# Patient Record
Sex: Female | Born: 2000 | Race: Asian | Hispanic: No | Marital: Single | Smoking: Never smoker
Health system: Southern US, Community
[De-identification: ages and names within clinical notes are randomized; demographics above are authoritative.]

## PROBLEM LIST (undated history)

## (undated) DIAGNOSIS — F419 Anxiety disorder, unspecified: Secondary | ICD-10-CM

## (undated) DIAGNOSIS — R519 Headache, unspecified: Secondary | ICD-10-CM

## (undated) HISTORY — PX: WISDOM TOOTH EXTRACTION: SHX21

---
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
## 2021-12-16 ENCOUNTER — Ambulatory Visit: Payer: BC Managed Care – PPO | Admitting: Podiatry

## 2021-12-16 DIAGNOSIS — R519 Headache, unspecified: Secondary | ICD-10-CM | POA: Insufficient documentation

## 2021-12-16 DIAGNOSIS — R51 Headache: Secondary | ICD-10-CM | POA: Insufficient documentation

## 2021-12-16 DIAGNOSIS — L600 Ingrowing nail: Secondary | ICD-10-CM | POA: Diagnosis not present

## 2021-12-18 ENCOUNTER — Telehealth: Payer: Self-pay | Admitting: Podiatry

## 2021-12-18 NOTE — Telephone Encounter (Signed)
Pts Mom called earlier and left a voice message on the nurse line but haven't heard back from anyone as of yet. She states they are leaving to go out of the country to [place] tomorrow and had concerns. Pt had ingrown toe nail removed on 4/4. She wants to make sure her symptoms are normal. She states she has pain that radiates from her toe to her thigh in which it may be nerve related. She wants to talk with someone about the treatment as well before she leaves the county.  ? ?Please advise. ?

## 2021-12-23 ENCOUNTER — Encounter: Payer: Self-pay | Admitting: Podiatry

## 2021-12-23 NOTE — Progress Notes (Signed)
?  Subjective:  ?Patient ID: Angela Hobbs, female    DOB: April 20, 2001,  MRN: [id number] ? ?Chief Complaint  ?Patient presents with  ? Ingrown Toenail  ? ? ?21 y.o. female presents with the above complaint.  Patient presents with right hallux medial border ingrown.  It is painful to touch is red swollen.  She would like to have it removed.  She states it hurts with ambulation and hurts with pressure pain scale is 8 out of 10.  She has not seen anyone else prior to seeing me.  She is taking antibiotics.  She denies any other acute complaints I encouraged her to finish her antibiotic course ? ? ?Review of Systems: Negative except as noted in the HPI. Denies N/V/F/Ch. ? ?No past medical history on file. ? ?Current Outpatient Medications:  ?  norgestimate-ethinyl estradiol (ORTHO-CYCLEN) 0.25-35 MG-MCG tablet, Take by mouth., Disp: , Rfl:  ?  norgestimate-ethinyl estradiol (SPRINTEC 28) 0.25-35 MG-MCG tablet, Take 1 tablet by mouth daily., Disp: , Rfl:  ?  propranolol (INDERAL) 20 MG tablet, Take by mouth., Disp: , Rfl:  ?  cephALEXin (KEFLEX) 500 MG capsule, Take by mouth every 8 (eight) hours., Disp: , Rfl:  ?  propranolol (INDERAL) 20 MG tablet, Take 20 mg by mouth 2 (two) times daily as needed., Disp: , Rfl:  ?  SPRINTEC 28 0.25-35 MG-MCG tablet, Take 1 tablet by mouth daily., Disp: , Rfl:  ? ?Social History  ? ?Tobacco Use  ?Smoking Status Never  ?Smokeless Tobacco Never  ? ? ?Allergies  ?Allergen Reactions  ? Pollen Extract Swelling  ? ?Objective:  ?There were no vitals filed for this visit. ?There is no height or weight on file to calculate BMI. ?Constitutional Well developed. ?Well nourished.  ?Vascular Dorsalis pedis pulses palpable bilaterally. ?Posterior tibial pulses palpable bilaterally. ?Capillary refill normal to all digits.  ?No cyanosis or clubbing noted. ?Pedal hair growth normal.  ?Neurologic Normal speech. ?Oriented to person, place, and time. ?Epicritic sensation to light touch grossly present  bilaterally.  ?Dermatologic Painful ingrowing nail at medial nail borders of the hallux nail right. ?No other open wounds. ?No skin lesions.  ?Orthopedic: Normal joint ROM without pain or crepitus bilaterally. ?No visible deformities. ?No bony tenderness.  ? ?Radiographs: None ?Assessment:  ? ?1. Ingrown toenail of right foot   ? ?Plan:  ?Patient was evaluated and treated and all questions answered. ? ?Ingrown Nail, right ?-Patient elects to proceed with minor surgery to remove ingrown toenail removal today. Consent reviewed and signed by patient. ?-Ingrown nail excised. See procedure note. ?-Educated on post-procedure care including soaking. Written instructions provided and reviewed. ?-Patient to follow up in 2 weeks for nail check. ? ?Procedure: Excision of Ingrown Toenail ?Location: Right 1st toe medial nail borders. ?Anesthesia: Lidocaine 1% plain; 1.5 mL and Marcaine 0.5% plain; 1.5 mL, digital block. ?Skin Prep: Betadine. ?Dressing: Silvadene; telfa; dry, sterile, compression dressing. ?Technique: Following skin prep, the toe was exsanguinated and a tourniquet was secured at the base of the toe. The affected nail border was freed, split with a nail splitter, and excised. Chemical matrixectomy was then performed with phenol and irrigated out with alcohol. The tourniquet was then removed and sterile dressing applied. ?Disposition: Patient tolerated procedure well. Patient to return in 2 weeks for follow-up.  ? ?No follow-ups on file. ?

## 2022-09-02 ENCOUNTER — Telehealth: Payer: Self-pay | Admitting: *Deleted

## 2022-09-02 NOTE — Telephone Encounter (Addendum)
Mother is wanting to know how to treat nail  until her upcoming appointment 09/15/21,patient has a possible ingrown, is draining, bleeding, discharge.  Patient is scheduled for 09/03/22@8 :30-[place] location, patient /mother aware.

## 2022-09-03 ENCOUNTER — Ambulatory Visit: Payer: BC Managed Care – PPO | Admitting: Podiatry

## 2022-09-03 DIAGNOSIS — L600 Ingrowing nail: Secondary | ICD-10-CM

## 2022-09-03 MED ORDER — DOXYCYCLINE HYCLATE 100 MG PO TABS: 100.0000 mg | ORAL_TABLET | Freq: Two times a day (BID) | ORAL | 0 refills | Status: AC

## 2022-09-10 NOTE — Progress Notes (Signed)
  Subjective:  Patient ID: Angela Hobbs, female    DOB: [birth year],  MRN: [id number]  Chief Complaint  Patient presents with   Ingrown Toenail    21 y.o. female presents with the above complaint.  Patient presents with right hallux lateral border ingrown.  Patient says the medial side is doing well.  She would like to have the bilateral lateral side removed it seems to be bothering her denies any other acute complaints.   Review of Systems: Negative except as noted in the HPI. Denies N/V/F/Ch.  No past medical history on file.  Current Outpatient Medications:    doxycycline (VIBRA-TABS) 100 MG tablet, Take 1 tablet (100 mg total) by mouth 2 (two) times daily for 10 days., Disp: 20 tablet, Rfl: 0   cephALEXin (KEFLEX) 500 MG capsule, Take by mouth every 8 (eight) hours., Disp: , Rfl:    norgestimate-ethinyl estradiol (ORTHO-CYCLEN) 0.25-35 MG-MCG tablet, Take by mouth., Disp: , Rfl:    norgestimate-ethinyl estradiol (SPRINTEC 28) 0.25-35 MG-MCG tablet, Take 1 tablet by mouth daily., Disp: , Rfl:    propranolol (INDERAL) 20 MG tablet, Take 20 mg by mouth 2 (two) times daily as needed., Disp: , Rfl:    propranolol (INDERAL) 20 MG tablet, Take by mouth., Disp: , Rfl:    SPRINTEC 28 0.25-35 MG-MCG tablet, Take 1 tablet by mouth daily., Disp: , Rfl:   Social History   Tobacco Use  Smoking Status Never  Smokeless Tobacco Never    Allergies  Allergen Reactions   Pollen Extract Swelling   Objective:  There were no vitals filed for this visit. There is no height or weight on file to calculate BMI. Constitutional Well developed. Well nourished.  Vascular Dorsalis pedis pulses palpable bilaterally. Posterior tibial pulses palpable bilaterally. Capillary refill normal to all digits.  No cyanosis or clubbing noted. Pedal hair growth normal.  Neurologic Normal speech. Oriented to person, place, and time. Epicritic sensation to light touch grossly present bilaterally.  Dermatologic  Painful ingrowing nail at lateral nail borders of the hallux nail right. No other open wounds. No skin lesions.  Orthopedic: Normal joint ROM without pain or crepitus bilaterally. No visible deformities. No bony tenderness.   Radiographs: None Assessment:   No diagnosis found.  Plan:  Patient was evaluated and treated and all questions answered.  Ingrown Nail, right -Patient elects to proceed with minor surgery to remove ingrown toenail removal today. Consent reviewed and signed by patient. -Ingrown nail excised. See procedure note. -Educated on post-procedure care including soaking. Written instructions provided and reviewed. -Patient to follow up in 2 weeks for nail check.  Procedure: Excision of Ingrown Toenail Location: Right 1st toe lateral nail borders. Anesthesia: Lidocaine 1% plain; 1.5 mL and Marcaine 0.5% plain; 1.5 mL, digital block. Skin Prep: Betadine. Dressing: Silvadene; telfa; dry, sterile, compression dressing. Technique: Following skin prep, the toe was exsanguinated and a tourniquet was secured at the base of the toe. The affected nail border was freed, split with a nail splitter, and excised. Chemical matrixectomy was then performed with phenol and irrigated out with alcohol. The tourniquet was then removed and sterile dressing applied. Disposition: Patient tolerated procedure well. Patient to return in 2 weeks for follow-up.   No follow-ups on file.

## 2022-09-15 ENCOUNTER — Ambulatory Visit: Payer: BC Managed Care – PPO | Admitting: Podiatry

## 2022-09-24 ENCOUNTER — Ambulatory Visit: Payer: BC Managed Care – PPO | Admitting: Podiatry

## 2023-03-12 ENCOUNTER — Encounter: Payer: Self-pay | Admitting: Podiatry

## 2023-03-12 ENCOUNTER — Ambulatory Visit: Payer: BC Managed Care – PPO | Admitting: Podiatry

## 2023-03-12 DIAGNOSIS — L600 Ingrowing nail: Secondary | ICD-10-CM | POA: Diagnosis not present

## 2023-03-12 NOTE — Progress Notes (Signed)
Subjective:   Patient ID: Angela Hobbs, female   DOB: 22 y.o.   MRN: [id number]   HPI Patient presents with ingrown toenail left big toe stating the right 1 that was worked on last year is doing well.  Presents with father   ROS      Objective:  Physical Exam  Neurovascular status intact incurvated lateral border left big toe painful when pressed difficult to wear shoe gear     Assessment:  Ingrown toenail deformity left hallux lateral border with pain     Plan:  H&P reviewed recommended correction explained procedure risk patient wants surgery and signed consent form.  Infiltrated left 60 mg like Marcaine mixture sterile prep done using sterile instrumentation removed the lateral border exposed matrix applied phenol 3 applications 30 seconds followed by alcohol lavage sterile dressing gave instructions on soaks and to wear dressing 24 hours take it off earlier if throbbing were to occur and encouraged to call with questions if they arise

## 2023-03-12 NOTE — Patient Instructions (Signed)

## 2024-06-02 ENCOUNTER — Other Ambulatory Visit: Payer: Self-pay | Admitting: Otolaryngology

## 2024-06-02 DIAGNOSIS — R591 Generalized enlarged lymph nodes: Secondary | ICD-10-CM

## 2024-06-02 DIAGNOSIS — J358 Other chronic diseases of tonsils and adenoids: Secondary | ICD-10-CM

## 2024-06-08 ENCOUNTER — Inpatient Hospital Stay
Admission: RE | Admit: 2024-06-08 | Discharge: 2024-06-08 | Discharge status: Home / Self Care | Source: Ambulatory Visit | Attending: Otolaryngology | Admitting: Otolaryngology

## 2024-06-08 DIAGNOSIS — J358 Other chronic diseases of tonsils and adenoids: Secondary | ICD-10-CM

## 2024-06-08 DIAGNOSIS — R591 Generalized enlarged lymph nodes: Secondary | ICD-10-CM

## 2024-06-08 MED ORDER — IOPAMIDOL (ISOVUE-300) INJECTION 61%
75.0000 mL | Freq: Once | INTRAVENOUS | Status: AC | PRN
Start: 2024-06-08 — End: 2024-06-08
  Administered 2024-06-08: 75 mL via INTRAVENOUS

## 2024-07-05 ENCOUNTER — Other Ambulatory Visit: Payer: Self-pay | Admitting: Otolaryngology

## 2024-08-02 ENCOUNTER — Other Ambulatory Visit: Payer: Self-pay | Admitting: Otolaryngology

## 2024-08-21 ENCOUNTER — Encounter: Payer: Self-pay | Admitting: Otolaryngology

## 2024-08-22 NOTE — Discharge Instructions (Signed)
T & A INSTRUCTION SHEET - [facility]  CREIGHTON VAUGHT, MD   INFORMATION SHEET FOR A TONSILLECTOMY AND ADENDOIDECTOMY  About Your Tonsils and Adenoids  The tonsils and adenoids are normal body tissues that are part of our immune system.  They normally help to protect us against diseases that may enter our mouth and nose. However, sometimes the tonsils and/or adenoids become too large and obstruct our breathing, especially at night.    If either of these things happen it helps to remove the tonsils and adenoids in order to become healthier. The operation to remove the tonsils and adenoids is called a tonsillectomy and adenoidectomy.  The Location of Your Tonsils and Adenoids  The tonsils are located in the back of the throat on both side and sit in a cradle of muscles. The adenoids are located in the roof of the mouth, behind the nose, and closely associated with the opening of the Eustachian tube to the ear.  Surgery on Tonsils and Adenoids  A tonsillectomy and adenoidectomy is a short operation which takes about thirty minutes.  This includes being put to sleep and being awakened. Tonsillectomies and adenoidectomies are performed at [facility] and may require observation period in the recovery room prior to going home. Children are required to remain in recovery for at least 45 minutes.   Following the Operation for a Tonsillectomy  A cautery machine is used to control bleeding. Bleeding from a tonsillectomy and adenoidectomy is minimal and postoperatively the risk of bleeding is approximately four percent, although this rarely life threatening.  After your tonsillectomy and adenoidectomy post-op care at home: 1. Our patients are able to go home the same day. You may be given prescriptions for pain medications, if indicated. 2. It is extremely important to remember that fluid intake is of utmost importance after a tonsillectomy. The  amount that you drink must be maintained in the postoperative period. A good indication of whether a child is getting enough fluid is whether his/her urine output is constant. As long as children are urinating or wetting their diaper every 6 - 8 hours this is usually enough fluid intake.   3. Although rare, this is a risk of some bleeding in the first ten days after surgery. This usually occurs between day five and nine postoperatively. This risk of bleeding is approximately four percent. If you or your child should have any bleeding you should remain calm and notify our office or go directly to the emergency room at [facility] where they will contact us. Our doctors are available seven days a week for notification. We recommend sitting up quietly in a chair, place an ice pack on the front of the neck and spitting out the blood gently until we are able to contact you. Adults should gargle gently with ice water and this may help stop the bleeding. If the bleeding does not stop after a short time, i.e. 10 to 15 minutes, or seems to be increasing again, please contact us or go to the hospital.   4. It is common for the pain to be worse at 5 - 7 days postoperatively. This occurs because the "scab" is peeling off and the mucous membrane (skin of the throat) is growing back where the tonsils were.   5. It is common for a low-grade fever, less than 102, during the first week after a tonsillectomy and adenoidectomy. It is usually due to not   drinking enough liquids, and we suggest your use liquid Tylenol (acetaminophen) or the pain medicine with Tylenol (acetaminophen) prescribed in order to keep your temperature below 102. Please follow the directions on the back of the bottle. 6. Recommendations for post-operative pain in children and adults: a) For Children 12 and younger: Recommendations are for oral Tylenol (acetaminophen) and oral Motrin (Ibuprofen) along with a prescription dose of  Prednisolone which is a steroid to help with pain and swelling. Administer the Tylenol (acetaminophen) and Motrin as stated on bottle for patient's age/weight. Sometimes it may be necessary to alternate the Tylenol (acetaminophen) and Motrin for improved pain control. Motrin does last slightly longer so many patients benefit from being given this prior to bedtime. All children should avoid Aspirin products for 2 weeks following surgery. b) For children over the age of 12: Tylenol (acetaminophen) is the preferred first choice for pain control. Depending on your child's size, sometimes they will be given a combination of Tylenol (acetaminophen) and hydrocodone medication or sometimes it will be recommended they take Motrin (ibuprofen) in addition to the Tylenol (acetaminophen). Narcotics should always be used with caution in children following surgery as they can suppress their breathing and switching to over the counter Tylenol (acetaminophen) and Motrin (ibuprofen) as soon as possible is recommended. All patients should avoid Aspirin products for 2 weeks following surgery. c) Adults: Usually adults will require a narcotic pain medication following a tonsillectomy. This usually has either hydrocodone or oxycodone in it and can usually be taken every 4 to 6 hours as needed for moderate pain. If the medication does not have Tylenol (acetaminophen) in it, you may also supplement Tylenol (acetaminophen) as needed every 4 to 6 hours for breakthrough or mild pain. Adults are also given Viscous Lidocaine to swish and spit every 6 hours to help with topical pain. Adults should avoid Aspirin, Aleve, Motrin, and Ibuprofen products for 2 weeks following surgery as they can increase your risk of bleeding. 7. If you happen to look in the mirror or into your child's mouth you will see white/gray patches on the back of the throat. This is what a scab looks like in the mouth and is normal after having a tonsillectomy and  adenoidectomy. They will disappear once the tonsil areas heal completely. However, it may cause a noticeable odor, and this too will disappear with time.     8. You or your child may experience ear pain after having a tonsillectomy and adenoidectomy.  This is called referred pain and comes from the throat, but it is felt in the ears.  Ear pain is quite common and expected. It will usually go away after ten days. There is usually nothing wrong with the ears, and it is primarily due to the healing area stimulating the nerve to the ear that runs along the side of the throat. Use either the prescribed pain medicine or Tylenol (acetaminophen) as needed.  9. The throat tissues after a tonsillectomy are obviously sensitive. Smoking around children who have had a tonsillectomy significantly increases the risk of bleeding. DO NOT SMOKE!  What to Expect Each Day  First Day at Home 1. Patients will be discharged home the same day.  2. Drink at least four glasses of liquid a day. Clear, cool liquids are recommended. Fruit juices containing citric acid are not recommended because they tend to cause pain. Carbonated beverages are allowed if you pour them from glass to glass to remove the bubbles as these tend to cause   discomfort. Avoid alcoholic beverages.  3. Eat very soft foods such as soups, broth, jello, custard, pudding, ice cream, popsicles, applesauce, mashed potatoes, and in general anything that you can crush between your tongue and the roof of your mouth. Try adding Carnation Instant Breakfast Mix into your food for extra calories. It is not uncommon to lose 5 to 10 pounds of fluid weight. The weight will be gained back quickly once you're feeling better and drinking more.  4. Sleep with your head elevated on two pillows for about three days to help decrease the swelling.  5. DO NOT SMOKE!  Day Two  1. Rest as much as possible. Use common sense in your activities.  2. Continue drinking at least four glasses  of liquid per day.  3. Follow the soft diet.  4. Use your pain medication as needed.  Day Three  1. Advance your activity as you are able and continue to follow the previous day's suggestions.  Days Four Through Six  1. Advance your diet and begin to eat more solid foods such as chopped hamburger. 2. Advance your activities slowly. Children should be kept mostly around the house.  3. Not uncommonly, there will be more pain at this time. It is temporary, usually lasting a day or two.  Day Seven Through Ten  1. Most individuals by this time are able to return to work or school unless otherwise instructed. Consider sending children back to school for a half day on the first day back. 

## 2024-08-23 ENCOUNTER — Encounter: Admission: EM | Disposition: A | Payer: Self-pay | Source: Home / Self Care | Attending: Otolaryngology

## 2024-08-23 ENCOUNTER — Ambulatory Visit: Payer: Self-pay | Admitting: Anesthesiology

## 2024-08-23 ENCOUNTER — Encounter: Payer: Self-pay | Admitting: Otolaryngology

## 2024-08-23 ENCOUNTER — Ambulatory Visit: Admit: 2024-08-23 | Discharge: 2024-08-23 | Disposition: A | Attending: Otolaryngology | Admitting: Otolaryngology

## 2024-08-23 ENCOUNTER — Other Ambulatory Visit: Payer: Self-pay

## 2024-08-23 HISTORY — PX: TONSILLECTOMY: SHX5217

## 2024-08-23 HISTORY — DX: Headache, unspecified: R51.9

## 2024-08-23 HISTORY — DX: Anxiety disorder, unspecified: F41.9

## 2024-08-23 LAB — POCT PREGNANCY, URINE: Preg Test, Ur: NEGATIVE

## 2024-08-23 SURGERY — TONSILLECTOMY
Anesthesia: General | Laterality: Bilateral

## 2024-08-23 MED ORDER — SUGAMMADEX SODIUM 200 MG/2ML IV SOLN
INTRAVENOUS | Status: DC | PRN
  Administered 2024-08-23: 200 mg via INTRAVENOUS

## 2024-08-23 MED ORDER — ACETAMINOPHEN 10 MG/ML IV SOLN
1000.0000 mg | Freq: Once | INTRAVENOUS | Status: DC | PRN
  Administered 2024-08-23: 1000 mg via INTRAVENOUS

## 2024-08-23 MED ORDER — LIDOCAINE HCL (CARDIAC) PF 100 MG/5ML IV SOSY
PREFILLED_SYRINGE | INTRAVENOUS | Status: DC | PRN
  Administered 2024-08-23: 60 mg via INTRAVENOUS

## 2024-08-23 MED ORDER — LIDOCAINE HCL (PF) 2 % IJ SOLN
INTRAMUSCULAR | Status: AC
  Filled 2024-08-23: qty 5

## 2024-08-23 MED ORDER — ONDANSETRON HCL 4 MG/2ML IJ SOLN
INTRAMUSCULAR | Status: AC
  Filled 2024-08-23: qty 2

## 2024-08-23 MED ORDER — LACTATED RINGERS IV SOLN: INTRAVENOUS | Status: DC

## 2024-08-23 MED ORDER — ONDANSETRON HCL 4 MG/2ML IJ SOLN: 4.0000 mg | Freq: Once | INTRAMUSCULAR | Status: DC | PRN

## 2024-08-23 MED ORDER — PROPOFOL 10 MG/ML IV BOLUS
INTRAVENOUS | Status: AC
  Filled 2024-08-23: qty 20

## 2024-08-23 MED ORDER — ONDANSETRON HCL 4 MG PO TABS: 4.0000 mg | ORAL_TABLET | Freq: Three times a day (TID) | ORAL | 0 refills | Status: AC | PRN

## 2024-08-23 MED ORDER — SODIUM CHLORIDE 0.9 % IV SOLN: INTRAVENOUS | Status: DC

## 2024-08-23 MED ORDER — OXYCODONE HCL 5 MG/5ML PO SOLN: 5.0000 mg | Freq: Once | ORAL | Status: DC | PRN

## 2024-08-23 MED ORDER — OXYMETAZOLINE HCL 0.05 % NA SOLN
NASAL | Status: DC | PRN
  Administered 2024-08-23: 1 via TOPICAL

## 2024-08-23 MED ORDER — PROPOFOL 10 MG/ML IV BOLUS
INTRAVENOUS | Status: DC | PRN
  Administered 2024-08-23: 150 mg via INTRAVENOUS

## 2024-08-23 MED ORDER — DEXMEDETOMIDINE HCL IN NACL 80 MCG/20ML IV SOLN
INTRAVENOUS | Status: AC
  Filled 2024-08-23: qty 20

## 2024-08-23 MED ORDER — DEXMEDETOMIDINE HCL IN NACL 80 MCG/20ML IV SOLN
INTRAVENOUS | Status: DC | PRN
  Administered 2024-08-23: 4 ug via INTRAVENOUS

## 2024-08-23 MED ORDER — BUPIVACAINE HCL (PF) 0.25 % IJ SOLN
INTRAMUSCULAR | Status: DC | PRN
  Administered 2024-08-23: 2 mL

## 2024-08-23 MED ORDER — LIDOCAINE VISCOUS HCL 2 % MT SOLN: 10.0000 mL | Freq: Four times a day (QID) | OROMUCOSAL | 0 refills | Status: AC | PRN

## 2024-08-23 MED ORDER — FENTANYL CITRATE (PF) 50 MCG/ML IJ SOSY: 25.0000 ug | PREFILLED_SYRINGE | INTRAMUSCULAR | Status: DC | PRN

## 2024-08-23 MED ORDER — OXYCODONE HCL 5 MG PO TABS: 5.0000 mg | ORAL_TABLET | Freq: Once | ORAL | Status: DC | PRN

## 2024-08-23 MED ORDER — ONDANSETRON HCL 4 MG/2ML IJ SOLN
INTRAMUSCULAR | Status: DC | PRN
  Administered 2024-08-23: 4 mg via INTRAVENOUS

## 2024-08-23 MED ORDER — OXYCODONE HCL 5 MG/5ML PO SOLN: 5.0000 mg | ORAL | 0 refills | Status: AC | PRN

## 2024-08-23 MED ORDER — DEXAMETHASONE SODIUM PHOSPHATE 4 MG/ML IJ SOLN
INTRAMUSCULAR | Status: DC | PRN
  Administered 2024-08-23: 8 mg via INTRAVENOUS

## 2024-08-23 MED ORDER — MIDAZOLAM HCL 2 MG/2ML IJ SOLN
INTRAMUSCULAR | Status: AC
  Filled 2024-08-23: qty 2

## 2024-08-23 MED ORDER — ROCURONIUM BROMIDE 100 MG/10ML IV SOLN
INTRAVENOUS | Status: DC | PRN
  Administered 2024-08-23: 30 mg via INTRAVENOUS

## 2024-08-23 MED ORDER — FENTANYL CITRATE (PF) 100 MCG/2ML IJ SOLN
INTRAMUSCULAR | Status: DC | PRN
  Administered 2024-08-23 (×2): 50 ug via INTRAVENOUS

## 2024-08-23 MED ORDER — MIDAZOLAM HCL 5 MG/5ML IJ SOLN
INTRAMUSCULAR | Status: DC | PRN
  Administered 2024-08-23: 2 mg via INTRAVENOUS

## 2024-08-23 MED ORDER — DEXAMETHASONE SODIUM PHOSPHATE 4 MG/ML IJ SOLN
INTRAMUSCULAR | Status: AC
  Filled 2024-08-23: qty 2

## 2024-08-23 MED ORDER — FENTANYL CITRATE (PF) 100 MCG/2ML IJ SOLN
INTRAMUSCULAR | Status: AC
  Filled 2024-08-23: qty 2

## 2024-08-23 MED ORDER — ACETAMINOPHEN 10 MG/ML IV SOLN
INTRAVENOUS | Status: AC
  Filled 2024-08-23: qty 100

## 2024-08-23 SURGICAL SUPPLY — 15 items
BLADE ELECT COATED/INSUL 125 (ELECTRODE) ×1 IMPLANT
CANISTER SUCT 1200ML W/VALVE (MISCELLANEOUS) ×1 IMPLANT
CATH ROBINSON RED A/P 10FR (CATHETERS) ×1 IMPLANT
COAG SUCTION FOOTSWITCH 10FR (SUCTIONS) IMPLANT
COAGULATOR SUCTION 6 10FR HC (MISCELLANEOUS) ×1 IMPLANT
ELECTRODE REM PT RTRN 9FT ADLT (ELECTROSURGICAL) ×1 IMPLANT
GLOVE PI ULTRA LF STRL 7.5 (GLOVE) ×1 IMPLANT
KIT TURNOVER KIT A (KITS) ×1 IMPLANT
PACK TONSIL AND ADENOID CUSTOM (PACKS) ×1 IMPLANT
PENCIL SMOKE EVACUATOR (MISCELLANEOUS) ×1 IMPLANT
SLEEVE SUCTION 125 (MISCELLANEOUS) ×1 IMPLANT
SOLUTION ANTFG W/FOAM PAD STRL (MISCELLANEOUS) ×1 IMPLANT
SPONGE TONSIL 1 RF SGL (DISPOSABLE) IMPLANT
STRAP BODY AND KNEE 60X3 (MISCELLANEOUS) ×1 IMPLANT
SYR 5ML LL (SYRINGE) ×1 IMPLANT

## 2024-08-23 NOTE — H&P (Signed)
 ..  History and Physical paper copy reviewed and updated date of procedure and will be scanned into system.  Patient seen and examined.

## 2024-08-23 NOTE — Anesthesia Postprocedure Evaluation (Signed)
 Anesthesia Post Note  Patient: Angela Hobbs  Procedure(s) Performed: TONSILLECTOMY (Bilateral)  Patient location during evaluation: PACU Anesthesia Type: General Level of consciousness: awake and alert, oriented and patient cooperative Pain management: pain level controlled Vital Signs Assessment: post-procedure vital signs reviewed and stable Respiratory status: spontaneous breathing, nonlabored ventilation and respiratory function stable Cardiovascular status: blood pressure returned to baseline and stable Postop Assessment: adequate PO intake Anesthetic complications: no   No notable events documented.   Last Vitals:  Vitals:   08/23/24 0933 08/23/24 0940  BP: 106/70 120/79  Pulse: 86 83  Resp: (!) 21 12  Temp:  (!) 35.8 C  SpO2: 96% 99%    Last Pain:  Vitals:   08/23/24 0940  TempSrc:   PainSc: 0-No pain                 Alfonso Ruths

## 2024-08-23 NOTE — Anesthesia Procedure Notes (Signed)
 Procedure Name: Intubation Date/Time: 08/23/2024 8:43 AM  Performed by: Myra Lawless, CRNAPre-anesthesia Checklist: Patient identified, Patient being monitored, Timeout performed, Emergency Drugs available and Suction available Patient Re-evaluated:Patient Re-evaluated prior to induction Oxygen Delivery Method: Circle system utilized Preoxygenation: Pre-oxygenation with 100% oxygen Induction Type: IV induction Ventilation: Mask ventilation without difficulty Laryngoscope Size: Mac and 3 Grade View: Grade I Tube type: Oral Rae Tube size: 6.5 mm Number of attempts: 1 Airway Equipment and Method: Stylet Placement Confirmation: ETT inserted through vocal cords under direct vision, positive ETCO2 and breath sounds checked- equal and bilateral Secured at: 19 cm Tube secured with: Tape Dental Injury: Teeth and Oropharynx as per pre-operative assessment

## 2024-08-23 NOTE — Anesthesia Preprocedure Evaluation (Addendum)
 Anesthesia Evaluation  Patient identified by MRN, date of birth, ID band Patient awake    Reviewed: Allergy & Precautions, NPO status , Patient's Chart, lab work & pertinent test results  History of Anesthesia Complications Negative for: history of anesthetic complications  Airway Mallampati: I   Neck ROM: Full    Dental no notable dental hx.    Pulmonary neg pulmonary ROS   Pulmonary exam normal breath sounds clear to auscultation       Cardiovascular Exercise Tolerance: Good Normal cardiovascular exam Rhythm:Regular Rate:Normal  ECG 08/17/22:  NORMAL LEFT VENTRICULAR SYSTOLIC FUNCTION  NORMAL RIGHT VENTRICULAR SYSTOLIC FUNCTION  TRIVIAL REGURGITATION NOTED   NO VALVULAR STENOSIS  TRIVIAL MR, TR, PR  EF 50-55%     Neuro/Psych  Headaches PSYCHIATRIC DISORDERS Anxiety        GI/Hepatic negative GI ROS,,,  Endo/Other  negative endocrine ROS    Renal/GU negative Renal ROS     Musculoskeletal   Abdominal   Peds  Hematology negative hematology ROS (+)   Anesthesia Other Findings   Reproductive/Obstetrics                              Anesthesia Physical Anesthesia Plan  ASA: 2  Anesthesia Plan: General   Post-op Pain Management:    Induction: Intravenous  PONV Risk Score and Plan: 3 and Ondansetron , Dexamethasone  and Treatment may vary due to age or medical condition  Airway Management Planned: Oral ETT  Additional Equipment:   Intra-op Plan:   Post-operative Plan: Extubation in OR  Informed Consent: I have reviewed the patients History and Physical, chart, labs and discussed the procedure including the risks, benefits and alternatives for the proposed anesthesia with the patient or authorized representative who has indicated his/her understanding and acceptance.     Dental advisory given  Plan Discussed with: CRNA  Anesthesia Plan Comments: (Patient consented for  risks of anesthesia including but not limited to:  - adverse reactions to medications - damage to eyes, teeth, lips or other oral mucosa - nerve damage due to positioning  - sore throat or hoarseness - damage to heart, brain, nerves, lungs, other parts of body or loss of life  Informed patient about role of CRNA in peri- and intra-operative care.  Patient voiced understanding.)         Anesthesia Quick Evaluation

## 2024-08-23 NOTE — Op Note (Signed)
..  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  Absent adenoids so no adenoidectomy done.  Procedure: After the patient was identified in holding and the history and physical and consent was reviewed, the patient was taken to the operating room and placed in a supine position.  General endotracheal anesthesia was induced in the normal fashion.  At this time, the patient was rotated 45 degrees and a shoulder roll was placed.  At this time, a McIvor mouthgag was inserted into the patient's oral cavity and suspended from the Mayo stand without injury to teeth, lips, or gums.  Next a red rubber catheter was inserted into the patient left nostril for retraction of the uvula and soft palate superiorly.  Next a curved Alice clamp was attached to the patient's right superior tonsillar pole and retracted medially and inferiorly.  A Bovie electrocautery was used to dissect the patient's right tonsil in a subcapsular plane.  Meticulous hemostasis was achieved with Bovie suction cautery.  At this time, the mouth gag was released from suspension for 1 minute.  Attention now was directed to the patient's left side.  In a similar fashion the curved Alice clamp was attached to the superior pole and this was retracted medially and inferiorly and the tonsil was excised in a subcapsular plane with Bovie electrocautery.  After completion of the second tonsil, meticulous hemostasis was continued.  At this time, attention was directed to the patient's Adenoidectomy.  Under indirect visualization using an operating mirror, the adenoid tissue was visualized and noted to be absent in nature.   At this time, the patient's nasal cavity and oral cavity was  irrigated with sterile saline.  2ml of 0.5% Marcaine was injected into the anterior and posterior tonsillar fossa bilaterally.  Following this, the care of patient was returned to anesthesia, awakened, and transferred to recovery in stable condition.  Dispo:  PACU to home  Plan: Soft diet.  Limit exercise and strenuous activity for 2 weeks.  Fluid hydration  Recheck my office three weeks.   Zarea Diesing 9:05 AM 08/23/2024

## 2024-08-23 NOTE — Transfer of Care (Signed)
 Immediate Anesthesia Transfer of Care Note  Patient: Angela Hobbs  Procedure(s) Performed: TONSILLECTOMY (Bilateral)  Patient Location: PACU  Anesthesia Type: General  Level of Consciousness: awake, alert  and patient cooperative  Airway and Oxygen Therapy: Patient Spontanous Breathing and Patient connected to supplemental oxygen  Post-op Assessment: Post-op Vital signs reviewed, Patient's Cardiovascular Status Stable, Respiratory Function Stable, Patent Airway and No signs of Nausea or vomiting  Post-op Vital Signs: Reviewed and stable  Complications: No notable events documented.

## 2024-08-25 LAB — SURGICAL PATHOLOGY
